# Patient Record
Sex: Female | Born: 1970 | Race: White | Hispanic: No | Marital: Single | Smoking: Never smoker
Health system: Southern US, Community
[De-identification: ages and names within clinical notes are randomized; demographics above are authoritative.]

## PROBLEM LIST (undated history)

## (undated) DIAGNOSIS — T7840XA Allergy, unspecified, initial encounter: Secondary | ICD-10-CM

## (undated) HISTORY — DX: Allergy, unspecified, initial encounter: T78.40XA

## (undated) HISTORY — PX: NECK SURGERY: SHX720

---

## 2004-08-15 ENCOUNTER — Other Ambulatory Visit: Admission: RE | Admit: 2004-08-15 | Discharge: 2004-08-15 | Payer: Self-pay | Admitting: Obstetrics and Gynecology

## 2005-02-11 ENCOUNTER — Other Ambulatory Visit: Admission: RE | Admit: 2005-02-11 | Discharge: 2005-02-11 | Payer: Self-pay | Admitting: Obstetrics and Gynecology

## 2005-11-13 ENCOUNTER — Other Ambulatory Visit: Admission: RE | Admit: 2005-11-13 | Discharge: 2005-11-13 | Payer: Self-pay | Admitting: Obstetrics and Gynecology

## 2008-07-13 ENCOUNTER — Ambulatory Visit (HOSPITAL_COMMUNITY): Admission: RE | Admit: 2008-07-13 | Discharge: 2008-07-13 | Payer: Self-pay | Admitting: Neurological Surgery

## 2009-02-26 ENCOUNTER — Encounter: Payer: Self-pay | Admitting: Internal Medicine

## 2009-07-16 ENCOUNTER — Ambulatory Visit: Payer: Self-pay | Admitting: Internal Medicine

## 2009-07-16 DIAGNOSIS — J309 Allergic rhinitis, unspecified: Secondary | ICD-10-CM | POA: Insufficient documentation

## 2009-07-16 DIAGNOSIS — M5030 Other cervical disc degeneration, unspecified cervical region: Secondary | ICD-10-CM

## 2009-07-16 LAB — CONVERTED CEMR LAB
Bilirubin Urine: NEGATIVE
Glucose, Urine, Semiquant: NEGATIVE
Pap Smear: NORMAL
Specific Gravity, Urine: 1.01
WBC Urine, dipstick: NEGATIVE
pH: 5.5

## 2009-07-20 LAB — CONVERTED CEMR LAB
Alkaline Phosphatase: 75 units/L (ref 39–117)
Bilirubin, Direct: 0.1 mg/dL (ref 0.0–0.3)
Calcium: 9.3 mg/dL (ref 8.4–10.5)
Creatinine, Ser: 0.7 mg/dL (ref 0.4–1.2)
Direct LDL: 149.3 mg/dL
Eosinophils Relative: 1.2 % (ref 0.0–5.0)
GFR calc non Af Amer: 100.06 mL/min (ref >60)
HDL: 57.2 mg/dL (ref >39.00)
Lymphocytes Relative: 38.3 % (ref 12.0–46.0)
MCV: 92.5 fL (ref 78.0–100.0)
Monocytes Absolute: 0.3 10*3/uL (ref 0.1–1.0)
Neutrophils Relative %: 55.1 % (ref 43.0–77.0)
Platelets: 276 10*3/uL (ref 150.0–400.0)
Sodium: 143 meq/L (ref 135–145)
TSH: 0.97 microintl units/mL (ref 0.35–5.50)
Total Bilirubin: 0.9 mg/dL (ref 0.3–1.2)
Total CHOL/HDL Ratio: 4
Total Protein: 7.2 g/dL (ref 6.0–8.3)
Triglycerides: 51 mg/dL (ref 0.0–149.0)
VLDL: 10.2 mg/dL (ref 0.0–40.0)
WBC: 6.3 10*3/uL (ref 4.5–10.5)

## 2011-04-29 NOTE — Op Note (Signed)
NAME:  Jenna Clayton, Jenna Clayton NO.:  [id number]   MEDICAL RECORD NO.:  [id number]          PATIENT TYPE:  OIB   LOCATION:  3524                         FACILITY:  [facility]   PHYSICIAN:  Stefani Dama, M.D.  DATE OF BIRTH:  [birth year]   DATE OF PROCEDURE:  07/13/2008  DATE OF DISCHARGE:  07/13/2008                               OPERATIVE REPORT   PREOPERATIVE DIAGNOSIS:  Herniated nucleus pulposus C6-C7 right with  right cervical radiculopathy.   POSTOPERATIVE DIAGNOSIS:  Herniated nucleus pulposus C6-C7 right with  right cervical radiculopathy.   PROCEDURE:  Right C6-C7 laminotomy and diskectomy with METRx, operating  microscope, microdissection technique.   SURGEON:  Stefani Dama, MD.   FIRST ASSISTANT:  Cristi Loron, MD.   ANESTHESIA:  General endotracheal.   INDICATIONS:  Ms. Carnero is a 40 year old individual who has had  significant right C7 radiculopathy with weakness in her triceps and  numbness into the fingers of the hand.  She has not been responding to  conservative measures in passage of time and because of persistent  weakness, she has been advised regarding surgical microdiskectomy via  METRx procedure.   PROCEDURE:  The patient was brought to the operating room and placed on  the table in supine position.  After smooth induction of general  endotracheal anesthesia and placement of appropriate arterial and  central venous monitoring lines, she was placed in the 3-pin headrest  and placed into the sitting position.  The back of the neck was then  prepped with alcohol and DuraPrep and draped in sterile fashion.  Fluoroscopy was used to localize the C6-C7 level off to the right side.  The skin above this area was incised for a distance of 2 cm and then, a  K-wire was passed to the laminar arch of C6-C7 on the right side.  A  series of dilators were then passed over the K-wire to enlarge this  opening to the 16 mm size.  A 16 mm x 4 cm deep rigid  cannula was then  fixed to the operating table with a clamp.  Microscope was then draped  and brought into the field and over this area, the soft tissue fascia  over the interlaminar space at the junction with the facet was cleaned  with a monopolar cautery.  A 2-mm Kerrison punch was used to remove some  of the fascial tissue, and a laminotomy was created removing the  inferior margin of the lamina of C6 out to and including the first  quarter of the facet at C6-C7 on the right side.  This was done with a  high-speed drill.  Two mm and 3-mm Kerrison punches were then used to  enlarge this opening and take up the yellow ligament in this region.  The left lateral aspect of the dural edge and the takeoff of C7 nerve  root was identified.  Some epidural veins over this area were cauterized  and divided and then the undersurface of the nerve root was explored  carefully by mobilizing it with a small nerve hook.  As the nerve  could  be elevated on the medial aspect of the nerve at the junction with the  common dural tube, there was identified to be a glistening piece of  material which when incised revealed a fragment of disk.  This was  excised and further exploration yielded several other small fragments of  disk.  As these were removed, venous bleeding was encountered and some  tamponade with Gelfoam and thrombin was performed to control hemostasis.  Some small bleeding veins were also cauterized at this time.  Further  exploration yielded two other small fragments of disk material, but at  this point, it was noted that the C7 nerve root was significantly  relaxed and travel out into the foramen.  Further exploration with a  ball-tipped nerve hook yielded no other fragments of disk.  Area was  explored carefully and hemostasis was checked and once this was  obtained, a small pledget of Gelfoam was left over the laminotomy site.  The cannula was removed and then the fascia was closed with 3-0  Vicryl  in an interrupted fashion and then the skin was closed subcuticularly  with 3-0 Vicryl also.  Dermabond was placed on the skin.  The patient  was removed from the headpiece in the supine position.  She was awakened  in the operating room.  Blood loss was minimal.      Stefani Dama, M.D.  Electronically Signed     HJE/MEDQ  D:  07/13/2008  T:  07/14/2008  Job:  [id number]

## 2011-09-12 LAB — CBC
Hemoglobin: 14.1
Platelets: 270
RDW: 12.4
WBC: 6.7

## 2018-12-29 ENCOUNTER — Other Ambulatory Visit: Payer: Self-pay | Admitting: Obstetrics and Gynecology

## 2018-12-29 DIAGNOSIS — N6310 Unspecified lump in the right breast, unspecified quadrant: Secondary | ICD-10-CM

## 2019-01-03 ENCOUNTER — Ambulatory Visit
Admission: RE | Admit: 2019-01-03 | Discharge: 2019-01-03 | Disposition: A | Discharge status: Home / Self Care | Payer: PRIVATE HEALTH INSURANCE | Source: Ambulatory Visit | Attending: Obstetrics and Gynecology | Admitting: Obstetrics and Gynecology

## 2019-01-03 DIAGNOSIS — N6310 Unspecified lump in the right breast, unspecified quadrant: Secondary | ICD-10-CM

## 2020-03-18 ENCOUNTER — Emergency Department (HOSPITAL_BASED_OUTPATIENT_CLINIC_OR_DEPARTMENT_OTHER)
Admission: EM | Admit: 2020-03-18 | Discharge: 2020-03-18 | Disposition: A | Discharge status: Home / Self Care | Payer: BLUE CROSS/BLUE SHIELD | Attending: Emergency Medicine | Admitting: Emergency Medicine

## 2020-03-18 ENCOUNTER — Other Ambulatory Visit: Payer: Self-pay

## 2020-03-18 ENCOUNTER — Encounter (HOSPITAL_BASED_OUTPATIENT_CLINIC_OR_DEPARTMENT_OTHER): Payer: Self-pay | Admitting: Emergency Medicine

## 2020-03-18 ENCOUNTER — Emergency Department (HOSPITAL_BASED_OUTPATIENT_CLINIC_OR_DEPARTMENT_OTHER): Payer: BLUE CROSS/BLUE SHIELD

## 2020-03-18 DIAGNOSIS — S99922A Unspecified injury of left foot, initial encounter: Secondary | ICD-10-CM | POA: Diagnosis not present

## 2020-03-18 DIAGNOSIS — M79672 Pain in left foot: Secondary | ICD-10-CM | POA: Insufficient documentation

## 2020-03-18 DIAGNOSIS — M79673 Pain in unspecified foot: Secondary | ICD-10-CM

## 2020-03-18 MED ORDER — IBUPROFEN 800 MG PO TABS
800.0000 mg | ORAL_TABLET | Freq: Once | ORAL | Status: AC
  Administered 2020-03-18: 800 mg via ORAL
  Filled 2020-03-18: qty 1

## 2020-03-18 NOTE — Discharge Instructions (Addendum)
As discussed, your x-ray was negative for any bony fractures.  I placed you in a postoperative shoe with crutches for comfort.  You may take over-the-counter ibuprofen or Tylenol as needed for pain.  Continue to ice and elevate foot.  If your symptoms do not improve over the next week, follow-up with PCP for further evaluation.  Return to the ER for new or worsening symptoms.

## 2020-03-18 NOTE — ED Triage Notes (Addendum)
Pt states that she hurt her left foot, last night  - she states that tripped and hurt her left foot - patient reports outside lateral left foot

## 2020-03-18 NOTE — ED Provider Notes (Signed)
[facility] EMERGENCY DEPARTMENT Provider Note   CSN: [id number] Arrival date & time: 03/18/20  0941     History Chief Complaint  Patient presents with  . Foot Pain    Jenna Clayton is a 49 y.o. female with no significant past medical history who presents to the ED due to left foot pain that started Friday.  Patient notes she twisted her left ankle/foot on Friday while walking in a parking lot leaving dinner.  Patient denies falling fully to the ground.  Denies head injury and loss of consciousness.  Patient admits to left foot pain on the lateral aspect that has worsened over the past 2 days.  She has been taking Aleve with moderate relief.  Patient notes pain is worse with ambulation.  Pain is associated with mild edema.  Patient denies erythema, ecchymosis, and warmth.  Denies numbness and tingling of left foot.  No previous injury to left ankle/left foot.  History obtained from patient and past medical records. No interpreter used during encounter.      History reviewed. No pertinent past medical history.  Patient Active Problem List   Diagnosis Date Noted  . ALLERGIC RHINITIS 07/16/2009  . [place] DISEASE, CERVICAL 07/16/2009    History reviewed. No pertinent surgical history.   OB History   No obstetric history on file.     History reviewed. No pertinent family history.  Social History   Tobacco Use  . Smoking status: Never Smoker  . Smokeless tobacco: Never Used  Substance Use Topics  . Alcohol use: Yes  . Drug use: Not Currently    Home Medications Prior to Admission medications   Not on File    Allergies    Patient has no known allergies.  Review of Systems   Review of Systems  Musculoskeletal: Positive for arthralgias and gait problem.  Skin: Negative for color change.  Neurological: Negative for numbness.  All other systems reviewed and are negative.   Physical Exam Updated Vital Signs BP (!) 145/76 (BP Location: Left Arm)   Pulse  91   Temp 97.8 F (36.6 C) (Oral)   Resp 18   Ht 5\' 1"  (1.549 m)   Wt 77.1 kg   LMP 03/04/2020   SpO2 100%   BMI 32.12 kg/m   Physical Exam Vitals and nursing note reviewed.  Constitutional:      General: She is not in acute distress.    Appearance: She is not ill-appearing.  HENT:     Head: Normocephalic.  Eyes:     Pupils: Pupils are equal, round, and reactive to light.  Cardiovascular:     Rate and Rhythm: Normal rate and regular rhythm.     Pulses: Normal pulses.     Heart sounds: Normal heart sounds. No murmur. No friction rub. No gallop.   Pulmonary:     Effort: Pulmonary effort is normal.     Breath sounds: Normal breath sounds.  Abdominal:     General: Abdomen is flat. There is no distension.     Palpations: Abdomen is soft.     Tenderness: There is no abdominal tenderness. There is no guarding or rebound.  Musculoskeletal:     Cervical back: Neck supple.     Comments: Tenderness palpation over lateral aspect of left foot with mild edema.  Full range of motion of left ankle and all toes.  Distal pulses and sensation intact.  Full range of motion of left knee.  Soft compartments.  No erythema or  warmth.  Skin:    General: Skin is warm and dry.  Neurological:     General: No focal deficit present.     Mental Status: She is alert.  Psychiatric:        Mood and Affect: Mood normal.        Behavior: Behavior normal.     ED Results / Procedures / Treatments   Labs (all labs ordered are listed, but only abnormal results are displayed) Labs Reviewed - No data to display  EKG None  Radiology No results found.  Procedures Procedures (including critical care time)  Medications Ordered in ED Medications - No data to display  ED Course  I have reviewed the triage vital signs and the nursing notes.  Pertinent labs & imaging results that were available during my care of the patient were reviewed by me and considered in my medical decision making (see chart  for details).    MDM Rules/Calculators/A&P                     49 year old female presents the ED due to left foot pain after twisting her ankle/foot on Friday. No previous injury.  Stable vitals.  Patient in no acute distress and non-ill-appearing.  Tenderness to palpation over lateral aspect of left foot with mild edema.  Full range of motion of left ankle, all left toes, and left knee.  Left lower extremity neurovascularly intact.  Soft compartments.  No proximal fibular tenderness. Will obtain x-ray to rule out bony fractures. Ibuprofen given for pain. Suspect sprain.  X-ray personally reviewed which is negative for any bony fractures.  It did show pes cavus deformity.  Will place patient in postop shoe with crutches for comfort.  Patient advised to take over-the-counter ibuprofen or Tylenol as needed for pain.  Rice discussed with patient.  Advised patient to follow-up with PCP if symptoms do not improve in the next week. Strict ED precautions discussed with patient. Patient states understanding and agrees to plan. Patient discharged home in no acute distress and stable vitals Final Clinical Impression(s) / ED Diagnoses Final diagnoses:  Foot pain    Rx / DC Orders ED Discharge Orders    None       Jesusita Oka 03/18/20 1013    Rolan Bucco, MD 03/18/20 1110

## 2020-03-21 DIAGNOSIS — Z23 Encounter for immunization: Secondary | ICD-10-CM | POA: Diagnosis not present

## 2020-08-23 DIAGNOSIS — N6310 Unspecified lump in the right breast, unspecified quadrant: Secondary | ICD-10-CM | POA: Diagnosis not present

## 2020-08-27 ENCOUNTER — Other Ambulatory Visit: Payer: Self-pay | Admitting: Obstetrics and Gynecology

## 2020-08-27 ENCOUNTER — Ambulatory Visit
Admission: RE | Admit: 2020-08-27 | Discharge: 2020-08-27 | Disposition: A | Discharge status: Home / Self Care | Payer: BLUE CROSS/BLUE SHIELD | Source: Ambulatory Visit | Attending: Obstetrics and Gynecology | Admitting: Obstetrics and Gynecology

## 2020-08-27 ENCOUNTER — Ambulatory Visit
Admission: RE | Admit: 2020-08-27 | Discharge: 2020-08-27 | Disposition: A | Discharge status: Home / Self Care | Payer: BC Managed Care – PPO | Source: Ambulatory Visit | Attending: Obstetrics and Gynecology | Admitting: Obstetrics and Gynecology

## 2020-08-27 ENCOUNTER — Other Ambulatory Visit: Payer: Self-pay

## 2020-08-27 DIAGNOSIS — R928 Other abnormal and inconclusive findings on diagnostic imaging of breast: Secondary | ICD-10-CM | POA: Diagnosis not present

## 2020-08-27 DIAGNOSIS — N6310 Unspecified lump in the right breast, unspecified quadrant: Secondary | ICD-10-CM

## 2020-08-27 DIAGNOSIS — N6001 Solitary cyst of right breast: Secondary | ICD-10-CM | POA: Diagnosis not present

## 2022-04-10 ENCOUNTER — Encounter: Payer: Self-pay | Admitting: Internal Medicine

## 2022-04-23 IMAGING — US US ASPIRATION RIGHT BREAST
1 series · 6 of 6 positions shown · non-contrast
Comparison: Previous exams.

CLINICAL DATA: Patient with enlarging painful cyst over the 10
o'clock position right breast measuring 5.6 cm.

EXAM:
ULTRASOUND GUIDED RIGHT BREAST CYST ASPIRATION

[Series 1: us aspiration right breast · 0.09mm/px · 6 of 6 slices shown]
[im 1/6]
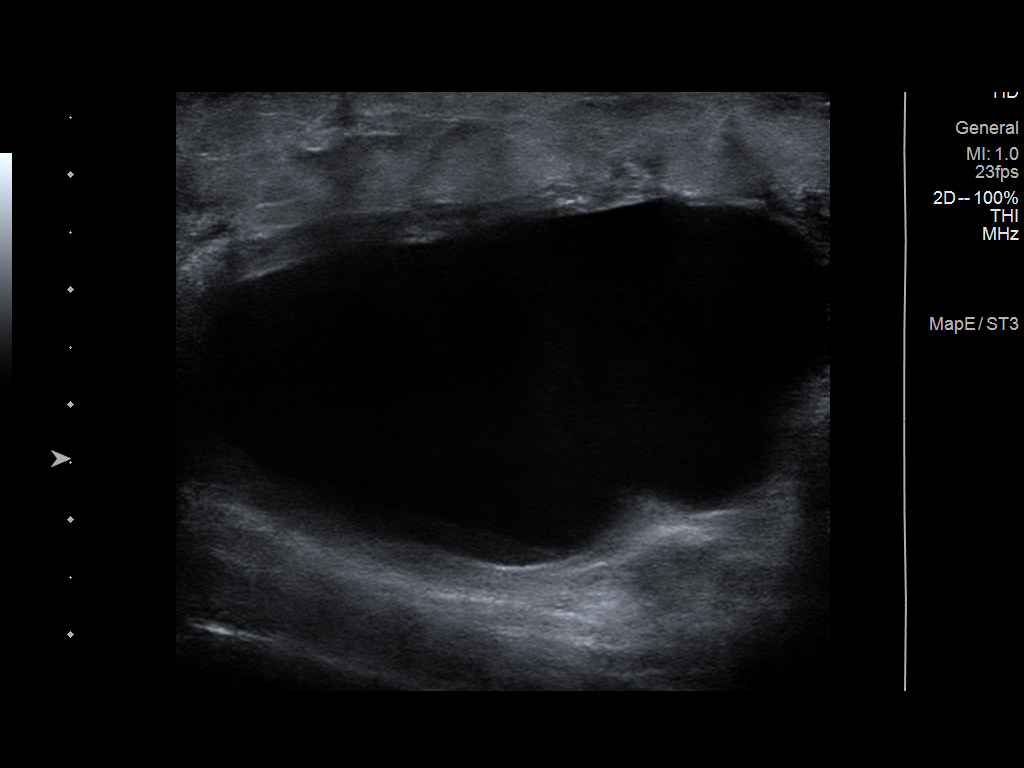
[im 2/6]
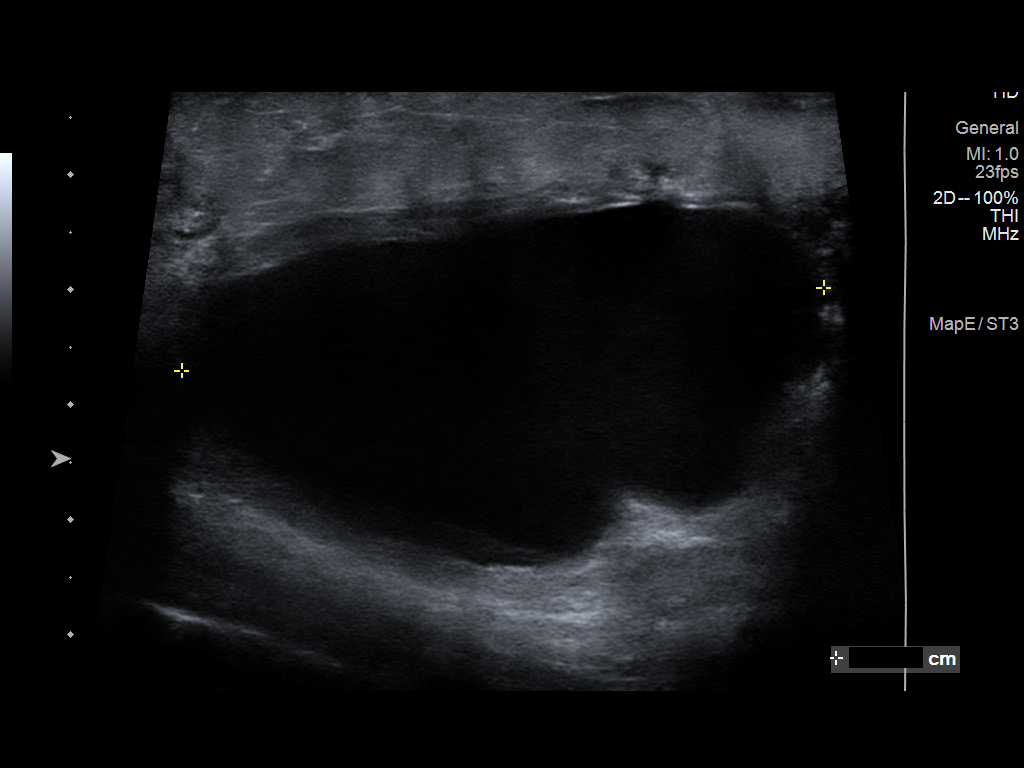
[im 3/6]
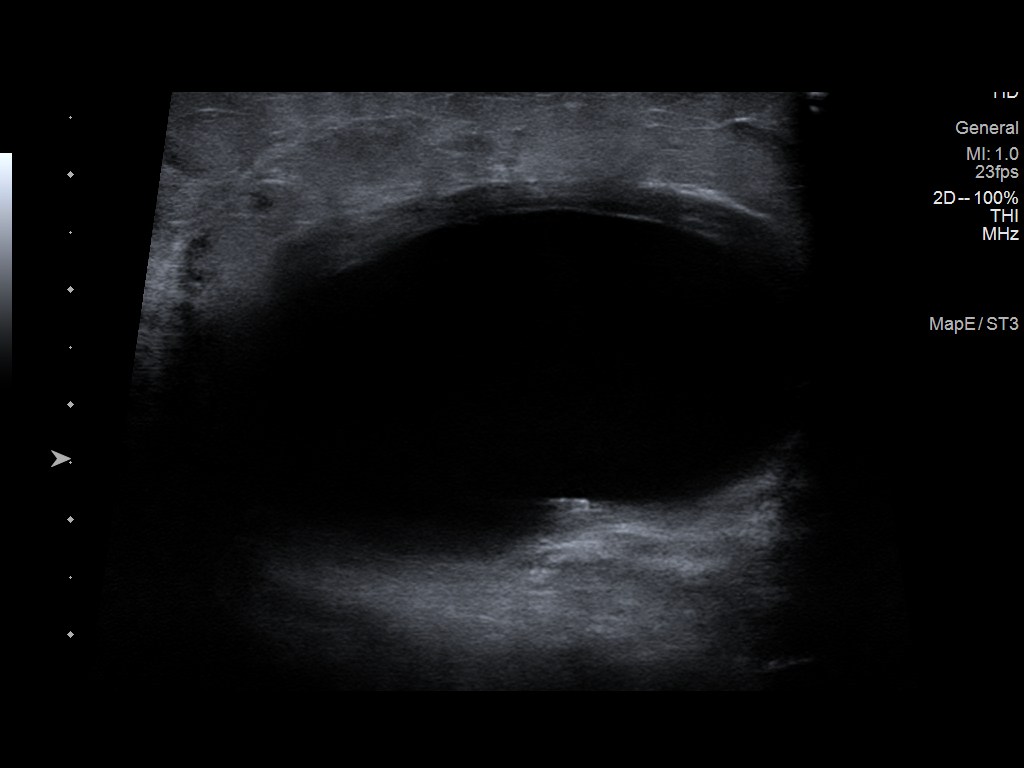
[im 4/6]
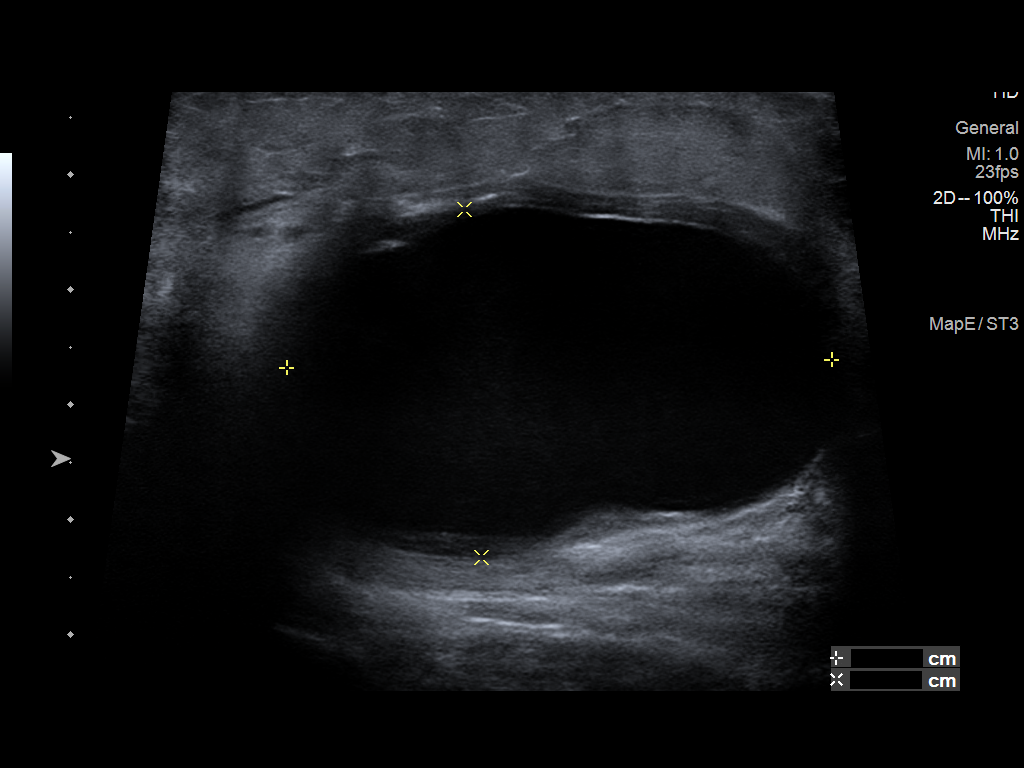
[im 5/6]
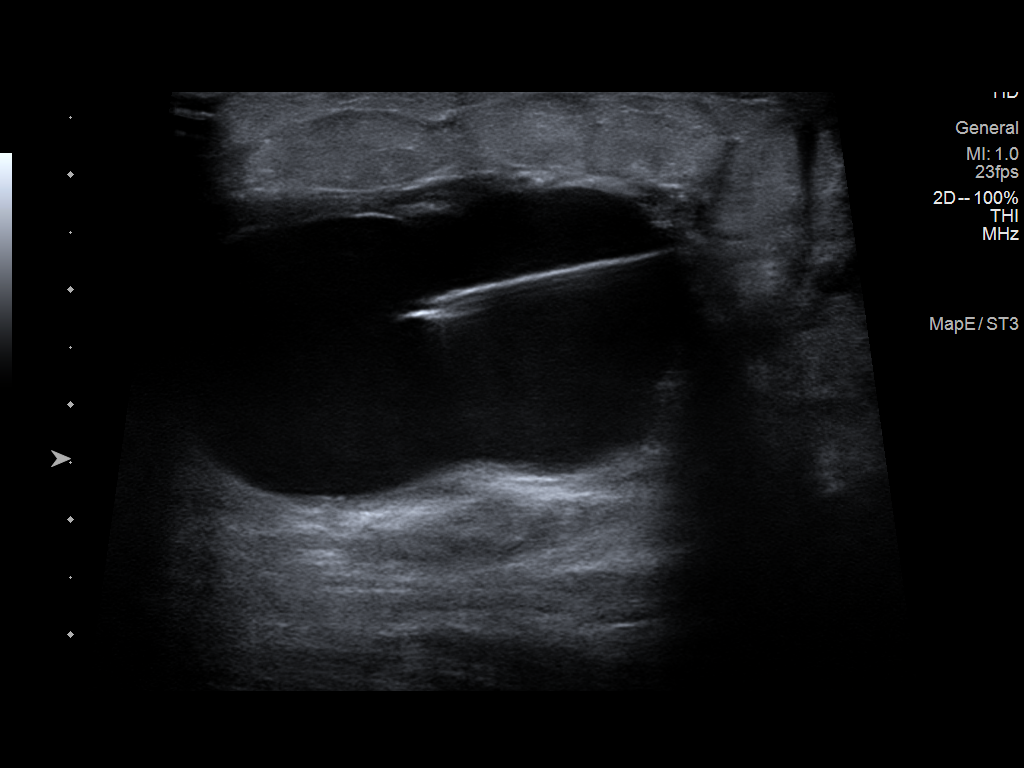
[im 6/6]
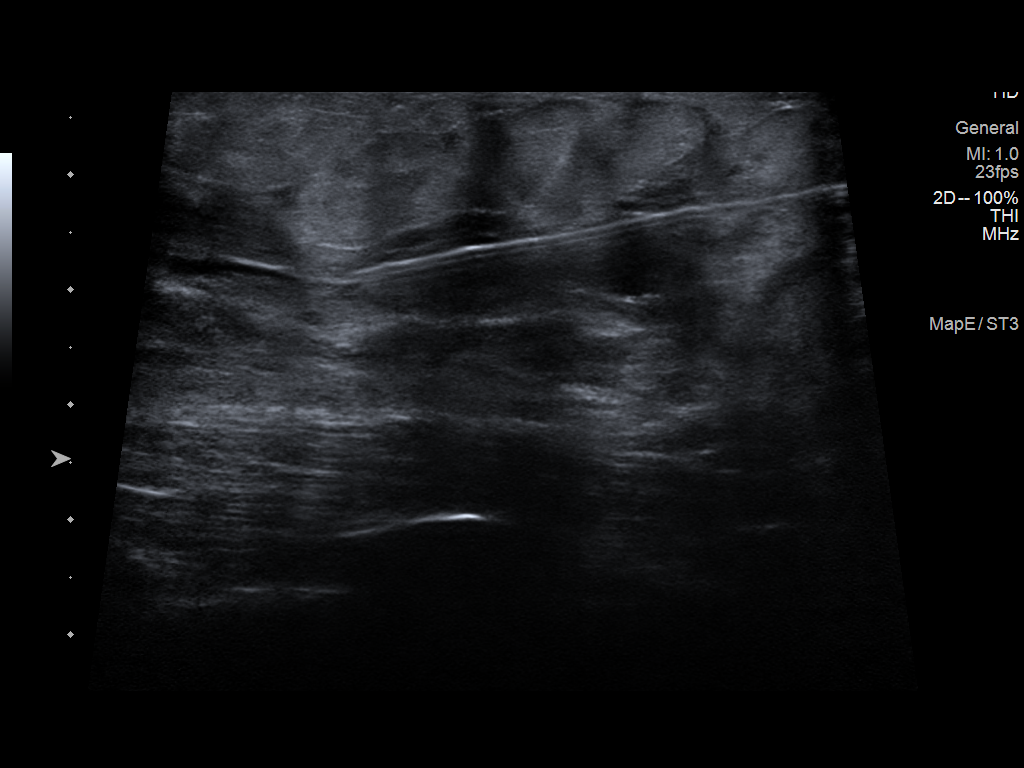

[6 of 6 positions shown; findings below may reference images not displayed]

PROCEDURE:
Using sterile technique, 1% lidocaine, under direct ultrasound
visualization, needle aspiration of targeted cyst at the 10 o'clock
position was performed. Approximately 33 mL of typical serous cyst
fluid was removed. Patient tolerated the procedure well without
complications.
IMPRESSION: Ultrasound-guided aspiration of right breast cyst. No apparent
complications.

## 2022-05-08 ENCOUNTER — Ambulatory Visit (AMBULATORY_SURGERY_CENTER): Payer: BC Managed Care – PPO | Admitting: *Deleted

## 2022-05-08 VITALS — Ht 62.0 in | Wt 170.0 lb

## 2022-05-08 DIAGNOSIS — Z1211 Encounter for screening for malignant neoplasm of colon: Secondary | ICD-10-CM

## 2022-05-08 MED ORDER — NA SULFATE-K SULFATE-MG SULF 17.5-3.13-1.6 GM/177ML PO SOLN: 1.0000 | Freq: Once | ORAL | 0 refills | Status: AC

## 2022-05-08 NOTE — Progress Notes (Signed)

## 2022-05-26 ENCOUNTER — Encounter: Payer: Self-pay | Admitting: Internal Medicine

## 2022-05-29 ENCOUNTER — Ambulatory Visit (AMBULATORY_SURGERY_CENTER): Payer: BC Managed Care – PPO | Admitting: Internal Medicine

## 2022-05-29 ENCOUNTER — Encounter: Payer: Self-pay | Admitting: Internal Medicine

## 2022-05-29 VITALS — BP 103/70 | HR 73 | Temp 97.5°F | Resp 15 | Ht 62.0 in | Wt 170.0 lb

## 2022-05-29 DIAGNOSIS — Z1211 Encounter for screening for malignant neoplasm of colon: Secondary | ICD-10-CM

## 2022-05-29 MED ORDER — SODIUM CHLORIDE 0.9 % IV SOLN: 500.0000 mL | INTRAVENOUS | Status: DC

## 2022-05-29 NOTE — Progress Notes (Signed)
To pacu, VSS. Report to [email or web address] 

## 2022-05-29 NOTE — Op Note (Signed)
[facility] Patient Name: Jenna Clayton Procedure Date: 05/29/2022 9:01 AM MRN: [id number] Endoscopist: Nicole Kindred "Eulah Pont ,  Age: 51 Referring MD:  Date of Birth: September 29, 1971 Gender: Female Account #: [id number] Procedure:                Colonoscopy Indications:              Screening for colorectal malignant neoplasm, This                            is the patient's first colonoscopy Medicines:                Monitored Anesthesia Care Procedure:                Pre-Anesthesia Assessment:                           - Prior to the procedure, a History and Physical                            was performed, and patient medications and                            allergies were reviewed. The patient's tolerance of                            previous anesthesia was also reviewed. The risks                            and benefits of the procedure and the sedation                            options and risks were discussed with the patient.                            All questions were answered, and informed consent                            was obtained. Prior Anticoagulants: The patient has                            taken no previous anticoagulant or antiplatelet                            agents. ASA Grade Assessment: II - A patient with                            mild systemic disease. After reviewing the risks                            and benefits, the patient was deemed in                            satisfactory condition to undergo the procedure.  After obtaining informed consent, the colonoscope                            was passed under direct vision. Throughout the                            procedure, the patient's blood pressure, pulse, and                            oxygen saturations were monitored continuously. The                            Olympus CF-HQ190L ([id number]) Colonoscope was                            introduced through the  anus and advanced to the the                            terminal ileum. The colonoscopy was performed                            without difficulty. The patient tolerated the                            procedure well. The quality of the bowel                            preparation was excellent. The terminal ileum,                            ileocecal valve, appendiceal orifice, and rectum                            were photographed. Scope In: 9:07:52 AM Scope Out: 9:24:25 AM Scope Withdrawal Time: 0 hours 12 minutes 55 seconds  Total Procedure Duration: 0 hours 16 minutes 33 seconds  Findings:                 The terminal ileum appeared normal.                           Non-bleeding internal hemorrhoids were found during                            retroflexion.                           The exam was otherwise without abnormality. Complications:            No immediate complications. Estimated Blood Loss:     Estimated blood loss: none. Impression:               - The examined portion of the ileum was normal.                           - Non-bleeding internal hemorrhoids.                           -  The examination was otherwise normal.                           - No specimens collected. Recommendation:           - Discharge patient to home (with escort).                           - Repeat colonoscopy in 10 years for screening                            purposes.                           - The findings and recommendations were discussed                            with the patient. Nicole Kindred "Eulah Pont,  05/29/2022 9:27:13 AM

## 2022-05-29 NOTE — Patient Instructions (Signed)
Repeat colonoscopy in 10 years for screening purposes.   YOU HAD AN ENDOSCOPIC PROCEDURE TODAY AT THE [facility]:   Refer to the procedure report that was given to you for any specific questions about what was found during the examination.  If the procedure report does not answer your questions, please call your gastroenterologist to clarify.  If you requested that your care partner not be given the details of your procedure findings, then the procedure report has been included in a sealed envelope for you to review at your convenience later.  YOU SHOULD EXPECT: Some feelings of bloating in the abdomen. Passage of more gas than usual.  Walking can help get rid of the air that was put into your GI tract during the procedure and reduce the bloating. If you had a lower endoscopy (such as a colonoscopy or flexible sigmoidoscopy) you may notice spotting of blood in your stool or on the toilet paper. If you underwent a bowel prep for your procedure, you may not have a normal bowel movement for a few days.  Please Note:  You might notice some irritation and congestion in your nose or some drainage.  This is from the oxygen used during your procedure.  There is no need for concern and it should clear up in a day or so.  SYMPTOMS TO REPORT IMMEDIATELY:  Following lower endoscopy (colonoscopy or flexible sigmoidoscopy):  Excessive amounts of blood in the stool  Significant tenderness or worsening of abdominal pains  Swelling of the abdomen that is new, acute  Fever of 100F or higher  For urgent or emergent issues, a gastroenterologist can be reached at any hour by calling [phone]. Do not use MyChart messaging for urgent concerns.    DIET:  We do recommend a small meal at first, but then you may proceed to your regular diet.  Drink plenty of fluids but you should avoid alcoholic beverages for 24 hours.  ACTIVITY:  You should plan to take it easy for the rest of today and you should  NOT DRIVE or use heavy machinery until tomorrow (because of the sedation medicines used during the test).    FOLLOW UP: Our staff will call the number listed on your records 24-72 hours following your procedure to check on you and address any questions or concerns that you may have regarding the information given to you following your procedure. If we do not reach you, we will leave a message.  We will attempt to reach you two times.  During this call, we will ask if you have developed any symptoms of COVID 19. If you develop any symptoms (ie: fever, flu-like symptoms, shortness of breath, cough etc.) before then, please call [phone].  If you test positive for Covid 19 in the 2 weeks post procedure, please call and report this information to us.    If any biopsies were taken you will be contacted by phone or by letter within the next 1-3 weeks.  Please call us at [phone] if you have not heard about the biopsies in 3 weeks.    SIGNATURES/CONFIDENTIALITY: You and/or your care partner have signed paperwork which will be entered into your electronic medical record.  These signatures attest to the fact that that the information above on your After Visit Summary has been reviewed and is understood.  Full responsibility of the confidentiality of this discharge information lies with you and/or your care-partner.  

## 2022-05-29 NOTE — Progress Notes (Signed)
GASTROENTEROLOGY PROCEDURE H&P NOTE   Primary Care Physician: Morrell Riddle, PA-C    Reason for Procedure:   Colon cancer screening  Plan:    Colonoscopy  Patient is appropriate for endoscopic procedure(s) in the ambulatory ([facility]) setting.  The nature of the procedure, as well as the risks, benefits, and alternatives were carefully and thoroughly reviewed with the patient. Ample time for discussion and questions allowed. The patient understood, was satisfied, and agreed to proceed.     HPI: Jenna Clayton is a 51 y.o. female who presents for colonoscopy for colon cancer screening. Denies blood in the stools, changes in bowel habits, weight loss. Denies fam hx of colon cancer.  Past Medical History:  Diagnosis Date   Allergy    SEASONAL    Past Surgical History:  Procedure Laterality Date   CESAREAN SECTION     EPIDURAL   NECK SURGERY     C4-C5    Prior to Admission medications   Medication Sig Start Date End Date Taking? Authorizing Provider  Cetirizine HCl (ZYRTEC ALLERGY) 10 MG CAPS Take by mouth daily.   Yes [provider]  COLLAGEN PO Take 100 mg by mouth daily. WITH BIOTIN 2500 MCG   Yes [provider]  Calcium Carbonate Antacid (TUMS PO) Take by mouth as needed.    [provider]  Cholecalciferol (VITAMIN D-3 PO) Take 50 mcg by mouth daily.    [provider]  clobetasol ointment (TEMOVATE) 0.05 %  01/06/22   [provider]    Current Outpatient Medications  Medication Sig Dispense Refill   Cetirizine HCl (ZYRTEC ALLERGY) 10 MG CAPS Take by mouth daily.     COLLAGEN PO Take 100 mg by mouth daily. WITH BIOTIN 2500 MCG     Calcium Carbonate Antacid (TUMS PO) Take by mouth as needed.     Cholecalciferol (VITAMIN D-3 PO) Take 50 mcg by mouth daily.     clobetasol ointment (TEMOVATE) 0.05 %      Current Facility-Administered Medications  Medication Dose Route Frequency Provider Last Rate Last Admin   0.9 %   sodium chloride infusion  500 mL Intravenous Continuous Imogene Burn, MD        Allergies as of 05/29/2022   (No Known Allergies)    Family History  Problem Relation Age of Onset   Crohn's disease Niece    Colon cancer Neg Hx    Colon polyps Neg Hx    Esophageal cancer Neg Hx    Rectal cancer Neg Hx    Stomach cancer Neg Hx     Social History   Socioeconomic History   Marital status: Single    Spouse name: Not on file   Number of children: Not on file   Years of education: Not on file   Highest education level: Not on file  Occupational History   Not on file  Tobacco Use   Smoking status: Never    Passive exposure: Never   Smokeless tobacco: Never  Vaping Use   Vaping Use: Never used  Substance and Sexual Activity   Alcohol use: Yes    Comment: WINE,SELTTZER ONCE A WEEK   Drug use: Not Currently   Sexual activity: Not on file  Other Topics Concern   Not on file  Social History Narrative   Not on file   Social Determinants of Health   Financial Resource Strain: Not on file  Food Insecurity: Not on file  Transportation Needs: Not on  file  Physical Activity: Not on file  Stress: Not on file  Social Connections: Not on file  Intimate Partner Violence: Not on file    Physical Exam: Vital signs in last 24 hours: BP 123/69   Pulse 88   Temp (!) 97.5 F (36.4 C)   Ht 5\' 2"  (1.575 m)   Wt 170 lb (77.1 kg)   LMP 05/28/2022 (Exact Date)   SpO2 97%   BMI 31.09 kg/m  GEN: NAD EYE: Sclerae anicteric ENT: MMM CV: Non-tachycardic Pulm: No increased work of breathing GI: Soft, NT/ND NEURO:  Alert & Oriented   05/30/2022, MD [facility]  05/29/2022 8:53 AM

## 2022-06-02 ENCOUNTER — Telehealth: Payer: Self-pay

## 2022-06-02 NOTE — Telephone Encounter (Signed)
  Follow up Call-     05/29/2022    7:37 AM  Call back number  Post procedure Call Back phone  # [phone]  Permission to leave phone message Yes     Patient questions:  Do you have a fever, pain , or abdominal swelling? No. Pain Score  0 *  Have you tolerated food without any problems? Yes.    Have you been able to return to your normal activities? Yes.    Do you have any questions about your discharge instructions: Diet   No. Medications  No. Follow up visit  No.  Do you have questions or concerns about your Care? No.  Actions: * If pain score is 4 or above: No action needed, pain <4.
